# Patient Record
Sex: Female | Born: 1985 | Race: White | Hispanic: Yes | Smoking: Never smoker
Health system: Southern US, Community
[De-identification: ages and names within clinical notes are randomized; demographics above are authoritative.]

## PROBLEM LIST (undated history)

## (undated) DIAGNOSIS — K589 Irritable bowel syndrome without diarrhea: Secondary | ICD-10-CM

---

## 2008-03-01 ENCOUNTER — Inpatient Hospital Stay (HOSPITAL_COMMUNITY): Admission: AD | Admit: 2008-03-01 | Discharge: 2008-03-01 | Payer: Self-pay | Admitting: Obstetrics and Gynecology

## 2008-04-01 ENCOUNTER — Emergency Department (HOSPITAL_COMMUNITY): Admission: EM | Admit: 2008-04-01 | Discharge: 2008-04-01 | Payer: Self-pay | Admitting: Emergency Medicine

## 2008-09-08 ENCOUNTER — Emergency Department (HOSPITAL_COMMUNITY): Admission: EM | Admit: 2008-09-08 | Discharge: 2008-09-08 | Payer: Self-pay | Admitting: Emergency Medicine

## 2011-01-04 LAB — URINALYSIS, ROUTINE W REFLEX MICROSCOPIC
Bilirubin Urine: NEGATIVE
Ketones, ur: NEGATIVE
Nitrite: NEGATIVE
Protein, ur: 30 — AB
pH: 7

## 2011-01-04 LAB — URINE MICROSCOPIC-ADD ON

## 2011-01-04 LAB — URINE CULTURE
Colony Count: NO GROWTH
Culture: NO GROWTH

## 2011-01-04 LAB — POCT PREGNANCY, URINE: Preg Test, Ur: NEGATIVE

## 2018-05-07 DIAGNOSIS — R112 Nausea with vomiting, unspecified: Secondary | ICD-10-CM | POA: Diagnosis not present

## 2018-05-07 DIAGNOSIS — K2970 Gastritis, unspecified, without bleeding: Secondary | ICD-10-CM | POA: Diagnosis not present

## 2018-05-07 DIAGNOSIS — Z6837 Body mass index (BMI) 37.0-37.9, adult: Secondary | ICD-10-CM | POA: Diagnosis not present

## 2018-05-16 DIAGNOSIS — E669 Obesity, unspecified: Secondary | ICD-10-CM | POA: Diagnosis not present

## 2018-05-16 DIAGNOSIS — J069 Acute upper respiratory infection, unspecified: Secondary | ICD-10-CM | POA: Diagnosis not present

## 2018-05-16 DIAGNOSIS — Z6837 Body mass index (BMI) 37.0-37.9, adult: Secondary | ICD-10-CM | POA: Diagnosis not present

## 2018-06-01 DIAGNOSIS — F418 Other specified anxiety disorders: Secondary | ICD-10-CM | POA: Diagnosis not present

## 2018-06-01 DIAGNOSIS — N926 Irregular menstruation, unspecified: Secondary | ICD-10-CM | POA: Diagnosis not present

## 2018-06-01 DIAGNOSIS — Z6838 Body mass index (BMI) 38.0-38.9, adult: Secondary | ICD-10-CM | POA: Diagnosis not present

## 2018-06-29 DIAGNOSIS — F418 Other specified anxiety disorders: Secondary | ICD-10-CM | POA: Diagnosis not present

## 2018-06-29 DIAGNOSIS — N946 Dysmenorrhea, unspecified: Secondary | ICD-10-CM | POA: Diagnosis not present

## 2018-08-03 DIAGNOSIS — N926 Irregular menstruation, unspecified: Secondary | ICD-10-CM | POA: Diagnosis not present

## 2018-08-03 DIAGNOSIS — F418 Other specified anxiety disorders: Secondary | ICD-10-CM | POA: Diagnosis not present

## 2019-06-25 ENCOUNTER — Other Ambulatory Visit: Payer: Self-pay

## 2019-06-25 ENCOUNTER — Encounter: Payer: Self-pay | Admitting: Emergency Medicine

## 2019-06-25 ENCOUNTER — Emergency Department
Admission: EM | Admit: 2019-06-25 | Discharge: 2019-06-25 | Disposition: A | Discharge status: Home / Self Care | Payer: No Typology Code available for payment source | Attending: Emergency Medicine | Admitting: Emergency Medicine

## 2019-06-25 ENCOUNTER — Emergency Department: Payer: No Typology Code available for payment source

## 2019-06-25 DIAGNOSIS — M25512 Pain in left shoulder: Secondary | ICD-10-CM | POA: Diagnosis not present

## 2019-06-25 DIAGNOSIS — Y93I9 Activity, other involving external motion: Secondary | ICD-10-CM | POA: Insufficient documentation

## 2019-06-25 DIAGNOSIS — Y999 Unspecified external cause status: Secondary | ICD-10-CM | POA: Diagnosis not present

## 2019-06-25 DIAGNOSIS — S161XXA Strain of muscle, fascia and tendon at neck level, initial encounter: Secondary | ICD-10-CM | POA: Diagnosis not present

## 2019-06-25 DIAGNOSIS — Y92410 Unspecified street and highway as the place of occurrence of the external cause: Secondary | ICD-10-CM | POA: Diagnosis not present

## 2019-06-25 DIAGNOSIS — S199XXA Unspecified injury of neck, initial encounter: Secondary | ICD-10-CM | POA: Diagnosis present

## 2019-06-25 DIAGNOSIS — R0781 Pleurodynia: Secondary | ICD-10-CM | POA: Insufficient documentation

## 2019-06-25 HISTORY — DX: Irritable bowel syndrome without diarrhea: K58.9

## 2019-06-25 MED ORDER — HYDROCODONE-ACETAMINOPHEN 5-325 MG PO TABS: 1.0000 | ORAL_TABLET | Freq: Four times a day (QID) | ORAL | 0 refills | Status: AC | PRN

## 2019-06-25 MED ORDER — NAPROXEN 500 MG PO TABS: 500.0000 mg | ORAL_TABLET | Freq: Two times a day (BID) | ORAL | 0 refills | Status: AC
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
## 2019-06-25 NOTE — Discharge Instructions (Signed)
Follow-up with your primary care provider if any continued problems or concerns.  Begin taking the naproxen 500 mg twice daily with food for inflammation and discomfort.  The Norco has hydrocodone and it which is a narcotic.  Take this every 6 hours as needed for severe pain.  Do not take this medication drive.  You may also use heat or ice to your muscles as needed for discomfort.  Move frequently to decrease soreness.  Under normal circumstances it takes 4 to 5 days before soreness and stiffness has improved.
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
## 2022-03-04 DIAGNOSIS — J069 Acute upper respiratory infection, unspecified: Secondary | ICD-10-CM | POA: Diagnosis not present

## 2022-04-08 DIAGNOSIS — R6889 Other general symptoms and signs: Secondary | ICD-10-CM | POA: Diagnosis not present

## 2022-04-08 DIAGNOSIS — J329 Chronic sinusitis, unspecified: Secondary | ICD-10-CM | POA: Diagnosis not present

## 2022-05-13 DIAGNOSIS — L299 Pruritus, unspecified: Secondary | ICD-10-CM | POA: Diagnosis not present

## 2022-05-13 DIAGNOSIS — M545 Low back pain, unspecified: Secondary | ICD-10-CM | POA: Diagnosis not present

## 2022-05-29 DIAGNOSIS — R112 Nausea with vomiting, unspecified: Secondary | ICD-10-CM | POA: Diagnosis not present

## 2022-05-29 DIAGNOSIS — R197 Diarrhea, unspecified: Secondary | ICD-10-CM | POA: Diagnosis not present

## 2022-10-11 DIAGNOSIS — S39012A Strain of muscle, fascia and tendon of lower back, initial encounter: Secondary | ICD-10-CM | POA: Diagnosis not present

## 2022-10-11 DIAGNOSIS — M545 Low back pain, unspecified: Secondary | ICD-10-CM | POA: Diagnosis not present

## 2023-01-16 DIAGNOSIS — Z1331 Encounter for screening for depression: Secondary | ICD-10-CM | POA: Diagnosis not present

## 2023-01-16 DIAGNOSIS — K589 Irritable bowel syndrome without diarrhea: Secondary | ICD-10-CM | POA: Diagnosis not present

## 2023-01-16 DIAGNOSIS — F418 Other specified anxiety disorders: Secondary | ICD-10-CM | POA: Diagnosis not present

## 2023-01-16 DIAGNOSIS — N946 Dysmenorrhea, unspecified: Secondary | ICD-10-CM | POA: Diagnosis not present

## 2023-07-17 DIAGNOSIS — Z0000 Encounter for general adult medical examination without abnormal findings: Secondary | ICD-10-CM | POA: Diagnosis not present

## 2023-07-17 DIAGNOSIS — Z131 Encounter for screening for diabetes mellitus: Secondary | ICD-10-CM | POA: Diagnosis not present

## 2023-07-17 DIAGNOSIS — N946 Dysmenorrhea, unspecified: Secondary | ICD-10-CM | POA: Diagnosis not present

## 2023-07-17 DIAGNOSIS — F418 Other specified anxiety disorders: Secondary | ICD-10-CM | POA: Diagnosis not present

## 2023-07-17 DIAGNOSIS — D649 Anemia, unspecified: Secondary | ICD-10-CM | POA: Diagnosis not present

## 2023-07-17 DIAGNOSIS — K589 Irritable bowel syndrome without diarrhea: Secondary | ICD-10-CM | POA: Diagnosis not present

## 2023-07-17 DIAGNOSIS — G5603 Carpal tunnel syndrome, bilateral upper limbs: Secondary | ICD-10-CM | POA: Diagnosis not present

## 2023-08-18 DIAGNOSIS — F418 Other specified anxiety disorders: Secondary | ICD-10-CM | POA: Diagnosis not present

## 2023-08-18 DIAGNOSIS — R748 Abnormal levels of other serum enzymes: Secondary | ICD-10-CM | POA: Diagnosis not present

## 2023-08-18 DIAGNOSIS — R7303 Prediabetes: Secondary | ICD-10-CM | POA: Diagnosis not present

## 2023-08-18 DIAGNOSIS — D509 Iron deficiency anemia, unspecified: Secondary | ICD-10-CM | POA: Diagnosis not present

## 2023-09-20 DIAGNOSIS — D509 Iron deficiency anemia, unspecified: Secondary | ICD-10-CM | POA: Diagnosis not present

## 2023-11-24 DIAGNOSIS — D509 Iron deficiency anemia, unspecified: Secondary | ICD-10-CM | POA: Diagnosis not present

## 2023-11-24 DIAGNOSIS — R748 Abnormal levels of other serum enzymes: Secondary | ICD-10-CM | POA: Diagnosis not present

## 2023-11-24 DIAGNOSIS — F418 Other specified anxiety disorders: Secondary | ICD-10-CM | POA: Diagnosis not present

## 2023-11-24 DIAGNOSIS — N946 Dysmenorrhea, unspecified: Secondary | ICD-10-CM | POA: Diagnosis not present

## 2023-11-24 DIAGNOSIS — R7303 Prediabetes: Secondary | ICD-10-CM | POA: Diagnosis not present

## 2023-12-13 DIAGNOSIS — N939 Abnormal uterine and vaginal bleeding, unspecified: Secondary | ICD-10-CM | POA: Diagnosis not present

## 2023-12-13 DIAGNOSIS — K760 Fatty (change of) liver, not elsewhere classified: Secondary | ICD-10-CM | POA: Diagnosis not present

## 2023-12-14 DIAGNOSIS — K8020 Calculus of gallbladder without cholecystitis without obstruction: Secondary | ICD-10-CM | POA: Diagnosis not present

## 2023-12-14 DIAGNOSIS — K760 Fatty (change of) liver, not elsewhere classified: Secondary | ICD-10-CM | POA: Diagnosis not present

## 2023-12-14 DIAGNOSIS — R748 Abnormal levels of other serum enzymes: Secondary | ICD-10-CM | POA: Diagnosis not present

## 2024-01-10 DIAGNOSIS — D509 Iron deficiency anemia, unspecified: Secondary | ICD-10-CM | POA: Diagnosis not present

## 2024-01-10 DIAGNOSIS — K760 Fatty (change of) liver, not elsewhere classified: Secondary | ICD-10-CM | POA: Diagnosis not present

## 2024-01-10 DIAGNOSIS — Z23 Encounter for immunization: Secondary | ICD-10-CM | POA: Diagnosis not present

## 2024-01-10 DIAGNOSIS — K769 Liver disease, unspecified: Secondary | ICD-10-CM | POA: Diagnosis not present

## 2024-01-10 DIAGNOSIS — N939 Abnormal uterine and vaginal bleeding, unspecified: Secondary | ICD-10-CM | POA: Diagnosis not present

## 2024-02-07 DIAGNOSIS — K769 Liver disease, unspecified: Secondary | ICD-10-CM | POA: Diagnosis not present

## 2024-02-08 DIAGNOSIS — N921 Excessive and frequent menstruation with irregular cycle: Secondary | ICD-10-CM | POA: Diagnosis not present

## 2024-02-08 DIAGNOSIS — N946 Dysmenorrhea, unspecified: Secondary | ICD-10-CM | POA: Diagnosis not present

## 2024-02-08 DIAGNOSIS — D251 Intramural leiomyoma of uterus: Secondary | ICD-10-CM | POA: Diagnosis not present

## 2024-02-22 DIAGNOSIS — D251 Intramural leiomyoma of uterus: Secondary | ICD-10-CM | POA: Diagnosis not present

## 2024-02-22 DIAGNOSIS — R748 Abnormal levels of other serum enzymes: Secondary | ICD-10-CM | POA: Diagnosis not present

## 2024-02-22 DIAGNOSIS — Z1331 Encounter for screening for depression: Secondary | ICD-10-CM | POA: Diagnosis not present

## 2024-02-22 DIAGNOSIS — N946 Dysmenorrhea, unspecified: Secondary | ICD-10-CM | POA: Diagnosis not present

## 2024-02-22 DIAGNOSIS — R7303 Prediabetes: Secondary | ICD-10-CM | POA: Diagnosis not present

## 2024-02-22 DIAGNOSIS — F418 Other specified anxiety disorders: Secondary | ICD-10-CM | POA: Diagnosis not present

## 2024-02-22 DIAGNOSIS — D509 Iron deficiency anemia, unspecified: Secondary | ICD-10-CM | POA: Diagnosis not present

## 2024-02-22 DIAGNOSIS — K769 Liver disease, unspecified: Secondary | ICD-10-CM | POA: Diagnosis not present

## 2024-03-15 DIAGNOSIS — M7918 Myalgia, other site: Secondary | ICD-10-CM | POA: Diagnosis not present

## 2024-03-15 DIAGNOSIS — M533 Sacrococcygeal disorders, not elsewhere classified: Secondary | ICD-10-CM | POA: Diagnosis not present
